# Patient Record
Sex: Female | Born: 1975 | Race: White | Hispanic: No | Marital: Married | Smoking: Never smoker
Health system: Southern US, Community
[De-identification: ages and names within clinical notes are randomized; demographics above are authoritative.]

## PROBLEM LIST (undated history)

## (undated) DIAGNOSIS — R002 Palpitations: Secondary | ICD-10-CM

## (undated) DIAGNOSIS — I341 Nonrheumatic mitral (valve) prolapse: Secondary | ICD-10-CM

## (undated) DIAGNOSIS — Z8669 Personal history of other diseases of the nervous system and sense organs: Secondary | ICD-10-CM

## (undated) DIAGNOSIS — Z9289 Personal history of other medical treatment: Secondary | ICD-10-CM

## (undated) DIAGNOSIS — I340 Nonrheumatic mitral (valve) insufficiency: Secondary | ICD-10-CM

## (undated) DIAGNOSIS — K559 Vascular disorder of intestine, unspecified: Secondary | ICD-10-CM

## (undated) HISTORY — DX: Nonrheumatic mitral (valve) insufficiency: I34.0

## (undated) HISTORY — DX: Vascular disorder of intestine, unspecified: K55.9

## (undated) HISTORY — DX: Personal history of other medical treatment: Z92.89

## (undated) HISTORY — DX: Personal history of other diseases of the nervous system and sense organs: Z86.69

## (undated) HISTORY — DX: Palpitations: R00.2

## (undated) HISTORY — DX: Nonrheumatic mitral (valve) prolapse: I34.1

---
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
## 2005-04-07 HISTORY — PX: GALLBLADDER SURGERY: SHX652

## 2007-04-08 HISTORY — PX: OTHER SURGICAL HISTORY: SHX169

## 2010-02-22 DIAGNOSIS — Z9289 Personal history of other medical treatment: Secondary | ICD-10-CM

## 2010-02-22 HISTORY — DX: Personal history of other medical treatment: Z92.89

## 2012-10-27 ENCOUNTER — Encounter: Payer: Self-pay | Admitting: *Deleted

## 2012-10-27 ENCOUNTER — Encounter: Payer: Self-pay | Admitting: Internal Medicine

## 2012-10-28 ENCOUNTER — Encounter: Payer: Self-pay | Admitting: Internal Medicine

## 2012-10-28 ENCOUNTER — Ambulatory Visit (INDEPENDENT_AMBULATORY_CARE_PROVIDER_SITE_OTHER): Payer: BC Managed Care – PPO | Admitting: Internal Medicine

## 2012-10-28 VITALS — BP 116/72 | HR 66 | Ht 63.0 in | Wt 146.0 lb

## 2012-10-28 DIAGNOSIS — Z79899 Other long term (current) drug therapy: Secondary | ICD-10-CM

## 2012-10-28 DIAGNOSIS — Z8669 Personal history of other diseases of the nervous system and sense organs: Secondary | ICD-10-CM

## 2012-10-28 DIAGNOSIS — R002 Palpitations: Secondary | ICD-10-CM

## 2012-10-28 DIAGNOSIS — R0609 Other forms of dyspnea: Secondary | ICD-10-CM

## 2012-10-28 DIAGNOSIS — I340 Nonrheumatic mitral (valve) insufficiency: Secondary | ICD-10-CM

## 2012-10-28 DIAGNOSIS — I059 Rheumatic mitral valve disease, unspecified: Secondary | ICD-10-CM

## 2012-10-28 DIAGNOSIS — I959 Hypotension, unspecified: Secondary | ICD-10-CM

## 2012-10-28 DIAGNOSIS — R9431 Abnormal electrocardiogram [ECG] [EKG]: Secondary | ICD-10-CM

## 2012-10-28 DIAGNOSIS — I341 Nonrheumatic mitral (valve) prolapse: Secondary | ICD-10-CM

## 2012-10-28 NOTE — Progress Notes (Signed)
OFFICE NOTE  Chief Complaint:  Routine office visit, shortness of breath  Primary Care Physician: No primary provider on file.  HPI:  Erica Hampton  is a 37 year old female who lives in [place]. She has a history of palpitations in the past as well as mild bileaflet prolapse and mitral regurgitation. Her palpitations have improved significantly. While they do still continue to happen, she is not particularly bothered by them. She also has migraine headaches and has had problems with hypotension and recently problems with fatigue and difficulty in sleeping. She is followed by a neurologist there who is adjusting her medicines. We did talk a little bit about her sleep problems which include difficulty falling asleep and staying asleep throughout the night. She wakes up with occasional jerking suggesting possible restless leg mechanism or maybe a sleep apnea-type syndrome. She could have underlying narcolepsy as well. I recommended that she talk with a neurologist about possible sleep study. Otherwise, she feels well. Denies any chest pain but has been having some increasing shortness of breath with activity. Her main issue is ongoing headaches.  PMHx:  Past Medical History  Diagnosis Date  . Mitral regurgitation   . Heart palpitations     history of  . Hx of migraine headaches   . Mitral valve prolapse   . Ischemic colitis   . History of echocardiogram 02/22/2010    EF >55%; mild MR; mod mitral valve prolapse    Past Surgical History  Procedure Laterality Date  . Tonsillectomy  1983  . Gallbladder surgery  2007  . Laprascopy  2009    FAMHx:  Family History  Problem Relation Age of Onset  . Cancer Maternal Grandfather   . Cancer Paternal Grandfather   . Asthma Mother     also leaky valve, RLS  . Hyperlipidemia Mother   . Other Father     colon polpys, RLS  . Hypertension Maternal Grandmother   . Hyperlipidemia Maternal Grandmother   . Hypertension Paternal Grandmother    also leaky valve  . Hypertension Brother     also PAD, RLS    SOCHx:   reports that she has never smoked. She does not have any smokeless tobacco history on file. She reports that  drinks alcohol. She reports that she does not use illicit drugs.  ALLERGIES:  Allergies  Allergen Reactions  . Phenergan (Promethazine Hcl) Anaphylaxis    "paralyzed w/IV form   . Adhesive (Tape) Dermatitis  . Biaxin (Clarithromycin)     ROS: A comprehensive review of systems was negative except for: Respiratory: positive for dyspnea on exertion Cardiovascular: positive for palpitations Neurological: positive for headaches  HOME MEDS: Current Outpatient Prescriptions  Medication Sig Dispense Refill  . co-enzyme Q-10 30 MG capsule Take 200 mg by mouth daily.      . Coconut Oil 1000 MG CAPS Take 2,000 mg by mouth daily.      . Diclofenac Potassium (CAMBIA PO) Take by mouth as needed.      . diphenhydrAMINE (BENADRYL) 25 MG tablet Take 25 mg by mouth daily.      Marland Kitchen ibuprofen (ADVIL,MOTRIN) 800 MG tablet Take 800 mg by mouth every 8 (eight) hours as needed for pain.      Marland Kitchen loratadine (ALAVERT) 10 MG tablet Take 10 mg by mouth daily as needed for allergies.      Marland Kitchen ondansetron (ZOFRAN-ODT) 4 MG disintegrating tablet Take 4 mg by mouth every 8 (eight) hours as needed for nausea.      Marland Kitchen  RELPAX 40 MG tablet as needed.      . topiramate (TOPAMAX) 100 MG tablet daily. 1 1/2 tablets daily       No current facility-administered medications for this visit.    LABS/IMAGING: No results found for this or any previous visit (from the past 48 hour(s)). No results found.  VITALS: BP 116/72  Pulse 66  Ht 5\' 3"  (1.6 m)  Wt 146 lb (66.225 kg)  BMI 25.87 kg/m2  EXAM: General appearance: alert and no distress Neck: no adenopathy, no carotid bruit, no JVD, supple, symmetrical, trachea midline and thyroid not enlarged, symmetric, no tenderness/mass/nodules Lungs: clear to auscultation bilaterally Heart: regular  rate and rhythm, S1, S2 normal and systolic murmur: early systolic 3/6, crescendo at apex Abdomen: soft, non-tender; bowel sounds normal; no masses,  no organomegaly Extremities: extremities normal, atraumatic, no cyanosis or edema Pulses: 2+ and symmetric Skin: Skin color, texture, turgor normal. No rashes or lesions Neurologic: Grossly normal  EKG: Normal sinus rhythm at 66 with T wave inversions in 2,3, and aVF and V3-V6  ASSESSMENT: 1. Dyspnea on exertion 2. Migraine headaches 3. Mitral valve prolapse with mitral regurgitation 4. Orthostatic hypotension  PLAN: 1.   Erica Hampton is having some worsening shortness of breath with exertion, but she thinks this is mostly deconditioning. She does have some new EKG changes including T-wave inversions inferiorly and laterally. This may or may not represent ischemia, however I like her to undergo exercise cardia metabolic stress testing. In addition has been 3 years since her last echocardiogram, like to check for stability of her mitral valve apparatus and a bubble study to look for PFO as a possible correlate to her migraine headaches. Unfortunately, there is not great data and closing PFO's as far as the ability to decrease migraine headache frequency. Will also go and check screening labs including a lipid profile CMP and A1c per her request today and for risk factors modification. Plan to see her back in a few weeks.  Chrystie Nose, MD, [facility] Attending Cardiologist [facility]  Erica Hampton C 10/28/2012, 11:31 AM

## 2012-10-28 NOTE — Patient Instructions (Signed)
Your physician has requested that you have an echocardiogram. Echocardiography is a painless test that uses sound waves to create images of your heart. It provides your doctor with information about the size and shape of your heart and how well your heart's chambers and valves are working. This procedure takes approximately one hour. There are no restrictions for this procedure.  Dr Rennis Golden has ordered a MET test that will also check your pulmonary function.  Your physician recommends that you return for lab work in: next 1-2 weeks. You will need to be fasting for this lab work.  CMP, A1C, Lipid  We will follow up with you in a month, after your tests.

## 2012-10-29 LAB — COMPREHENSIVE METABOLIC PANEL
AST: 20 U/L (ref 0–37)
Albumin: 4.7 g/dL (ref 3.5–5.2)
Alkaline Phosphatase: 51 U/L (ref 39–117)
BUN: 14 mg/dL (ref 6–23)
Creat: 0.86 mg/dL (ref 0.50–1.10)
Glucose, Bld: 92 mg/dL (ref 70–99)
Total Bilirubin: 0.5 mg/dL (ref 0.3–1.2)

## 2012-10-29 LAB — LIPID PANEL
Cholesterol: 200 mg/dL (ref 0–200)
HDL: 51 mg/dL (ref >39)
Total CHOL/HDL Ratio: 3.9 Ratio
Triglycerides: 87 mg/dL (ref <150)
VLDL: 17 mg/dL (ref 0–40)

## 2012-10-29 LAB — HEMOGLOBIN A1C: Mean Plasma Glucose: 103 mg/dL (ref <117)

## 2012-11-10 ENCOUNTER — Ambulatory Visit (HOSPITAL_COMMUNITY)
Admission: RE | Admit: 2012-11-10 | Discharge: 2012-11-10 | Disposition: A | Discharge status: Home / Self Care | Payer: BC Managed Care – PPO | Source: Ambulatory Visit | Attending: Cardiovascular Disease | Admitting: Cardiovascular Disease

## 2012-11-10 ENCOUNTER — Telehealth: Payer: Self-pay | Admitting: Cardiovascular Disease

## 2012-11-10 DIAGNOSIS — R0989 Other specified symptoms and signs involving the circulatory and respiratory systems: Secondary | ICD-10-CM

## 2012-11-10 DIAGNOSIS — I341 Nonrheumatic mitral (valve) prolapse: Secondary | ICD-10-CM

## 2012-11-10 DIAGNOSIS — I059 Rheumatic mitral valve disease, unspecified: Secondary | ICD-10-CM

## 2012-11-10 NOTE — Telephone Encounter (Signed)
Pt is here having a test. She is requesting a copy of her recent lab results. Please advise patient

## 2012-11-10 NOTE — Progress Notes (Signed)
2D Echo Performed 11/10/2012    Jeslynn Hollander, RCS  

## 2012-11-10 NOTE — Telephone Encounter (Signed)
Message forwarded to Medical Records to process ROI.  Pt in office for test and will stop by front desk after appt.

## 2012-12-13 ENCOUNTER — Encounter (INDEPENDENT_AMBULATORY_CARE_PROVIDER_SITE_OTHER): Payer: BC Managed Care – PPO

## 2012-12-13 DIAGNOSIS — R0602 Shortness of breath: Secondary | ICD-10-CM

## 2012-12-17 ENCOUNTER — Ambulatory Visit: Payer: BC Managed Care – PPO | Admitting: Internal Medicine

## 2012-12-23 ENCOUNTER — Encounter: Payer: Self-pay | Admitting: Internal Medicine

## 2012-12-23 ENCOUNTER — Ambulatory Visit (INDEPENDENT_AMBULATORY_CARE_PROVIDER_SITE_OTHER): Payer: BC Managed Care – PPO | Admitting: Internal Medicine

## 2012-12-23 VITALS — BP 112/78 | HR 68 | Ht 62.5 in | Wt 147.8 lb

## 2012-12-23 DIAGNOSIS — Q2112 Patent foramen ovale: Secondary | ICD-10-CM | POA: Insufficient documentation

## 2012-12-23 DIAGNOSIS — Q211 Atrial septal defect: Secondary | ICD-10-CM | POA: Insufficient documentation

## 2012-12-23 DIAGNOSIS — Z8669 Personal history of other diseases of the nervous system and sense organs: Secondary | ICD-10-CM

## 2012-12-23 DIAGNOSIS — D689 Coagulation defect, unspecified: Secondary | ICD-10-CM

## 2012-12-23 DIAGNOSIS — I059 Rheumatic mitral valve disease, unspecified: Secondary | ICD-10-CM

## 2012-12-23 DIAGNOSIS — R5381 Other malaise: Secondary | ICD-10-CM

## 2012-12-23 DIAGNOSIS — I340 Nonrheumatic mitral (valve) insufficiency: Secondary | ICD-10-CM

## 2012-12-23 DIAGNOSIS — R9439 Abnormal result of other cardiovascular function study: Secondary | ICD-10-CM | POA: Insufficient documentation

## 2012-12-23 DIAGNOSIS — Z01818 Encounter for other preprocedural examination: Secondary | ICD-10-CM

## 2012-12-23 DIAGNOSIS — I341 Nonrheumatic mitral (valve) prolapse: Secondary | ICD-10-CM

## 2012-12-23 NOTE — Progress Notes (Signed)
OFFICE NOTE  Chief Complaint:  Routine office visit, shortness of breath  Primary Care Physician: No primary provider on file.  HPI:  Erica Hampton  is a 37 year old female who lives in [place]. She has a history of palpitations in the past as well as mild bileaflet prolapse and mitral regurgitation. Her palpitations have improved significantly. While they do still continue to happen, she is not particularly bothered by them. She also has migraine headaches and has had problems with hypotension and recently problems with fatigue and difficulty in sleeping. She is followed by a neurologist there who is adjusting her medicines. We did talk a little bit about her sleep problems which include difficulty falling asleep and staying asleep throughout the night. She wakes up with occasional jerking suggesting possible restless leg mechanism or maybe a sleep apnea-type syndrome. She could have underlying narcolepsy as well. I recommended that she talk with a neurologist about possible sleep study. Otherwise, she feels well. Denies any chest pain but has been having some increasing shortness of breath with activity. Her main issue is ongoing headaches.  She underwent cardio metabolic testing on 12/13/2012. This demonstrated an RAR of 1.07 with a peak VO2 of only 66%. Peak heart rate was 84% predicted. PVCs were noted during exercise. Her she underwent pulmonary function testing as well which shows generally preserved pulmonary function.  She hasn't had an echocardiogram with saline contrast on 11/10/2012. This demonstrated moderate holosystolic bileaflet mitral valve prolapse. In addition there was a PFO noted by saline contrast injection. Shortly after that injection, she was noted to have intense chest pain that went to her jaw and eventually lessened followed by a migraine headache. She has been recently having more frequent migraine headaches. Just reports an episode recently where she was driving and  became totally unaware of where she was at. This persisted for a short period of time and then resolved, and is concerning for possible TIA.  PMHx:  Past Medical History  Diagnosis Date  . Mitral regurgitation   . Heart palpitations     history of  . Hx of migraine headaches   . Mitral valve prolapse   . Ischemic colitis   . History of echocardiogram 02/22/2010    EF >55%; mild MR; mod mitral valve prolapse    Past Surgical History  Procedure Laterality Date  . Tonsillectomy  1983  . Gallbladder surgery  2007  . Laprascopy  2009    FAMHx:  Family History  Problem Relation Age of Onset  . Cancer Maternal Grandfather   . Cancer Paternal Grandfather   . Asthma Mother     also leaky valve, RLS  . Hyperlipidemia Mother   . Other Father     colon polpys, RLS  . Hypertension Maternal Grandmother   . Hyperlipidemia Maternal Grandmother   . Hypertension Paternal Grandmother     also leaky valve  . Hypertension Brother     also PAD, RLS    SOCHx:   reports that she has never smoked. She does not have any smokeless tobacco history on file. She reports that  drinks alcohol. She reports that she does not use illicit drugs.  ALLERGIES:  Allergies  Allergen Reactions  . Phenergan [Promethazine Hcl] Anaphylaxis    "paralyzed w/IV form   . Adhesive [Tape] Dermatitis  . Biaxin [Clarithromycin]     ROS: A comprehensive review of systems was negative except for: Respiratory: positive for dyspnea on exertion Cardiovascular: positive for palpitations Neurological: positive for headaches  HOME MEDS: Current Outpatient Prescriptions  Medication Sig Dispense Refill  . co-enzyme Q-10 30 MG capsule Take 200 mg by mouth daily.      . Coconut Oil 1000 MG CAPS Take 2,000 mg by mouth daily.      . Diclofenac Potassium (CAMBIA PO) Take by mouth as needed.      . diphenhydrAMINE (BENADRYL) 25 MG tablet Take 25 mg by mouth daily.      Marland Kitchen ibuprofen (ADVIL,MOTRIN) 800 MG tablet Take 800  mg by mouth every 8 (eight) hours as needed for pain.      Marland Kitchen loratadine (ALAVERT) 10 MG tablet Take 10 mg by mouth daily as needed for allergies.      Marland Kitchen ondansetron (ZOFRAN-ODT) 4 MG disintegrating tablet Take 4 mg by mouth every 8 (eight) hours as needed for nausea.      . RELPAX 40 MG tablet as needed.      . topiramate (TOPAMAX) 100 MG tablet daily. 1 1/2 tablets daily       No current facility-administered medications for this visit.    LABS/IMAGING: No results found for this or any previous visit (from the past 48 hour(s)). No results found.  VITALS: BP 112/78  Pulse 68  Ht 5' 2.5" (1.588 m)  Wt 147 lb 12.8 oz (67.042 kg)  BMI 26.59 kg/m2  EXAM: deferred  EKG: deferred  ASSESSMENT: 1. Dyspnea on exertion - abnormal cardiometabolic testing with low VO2 of 66% 2. Migraine headaches 3. Holosystolic bifleaflet mitral valve prolapse with mitral regurgitation 4. Orthostatic hypotension 5. 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 Chest pain - fatigue  PLAN: 1.   Erica Hampton is having some worsening shortness of breath and more frequent headaches. After her echocardiogram she experienced intense chest pain and jaw pain following her contrast saline injection. She did have a brisk right to left shunt through a PFO. It is certainly possible that she could have had an air embolism event or possibly thrombus that led to chest pain. She could also have had coronary spasm, which I suspect she is at higher risk for given her migraines. Recently she had an episode where she was confused as to where she was while driving a route that she knows very well. This was very frightening for her and is suspicious for TIA.  I think now that we know that she has right to left shunting, I will strongly recommend starting low-dose aspirin 81 mg daily to help prevent against possible paradoxical embolus. I encouraged her to contact her neurologist to see if any of her medications are possibly playing a role  in her memory problem. Given the marked abnormality on her echo and her progressive shortness of breath, she may have some significant shunting and I feel that further evaluation with a TEE is warranted. The other question is as to whether or not she needs a coronary artery evaluation. There are a few traditional cardiac risk factors, however she may be having coronary artery spasm, but this would not likely explain why she has a low exertional VO2.  Shunting may explain this to some extent.  After discussing options with her and the risks and benefits of a TEE, she did provide informed consent in the office today. We'll try to arrange that for next week. Again, she may ultimately need cardiac catheterization to evaluate her coronaries.  Chrystie Nose, MD, [facility] Attending Cardiologist [facility]  HILTY,Kenneth C 12/23/2012, 4:38 PM

## 2012-12-23 NOTE — Patient Instructions (Addendum)
Please schedule a transesophageal echocardiogram with Dr. Rennis Golden.  You will need to have blood work done within a 7 day window of this procedure.  Please use a [facility] These are the closest to your address: [address], [address] [place], [place] [address] [phone]  9:00am-12:00pm  7:00am-4:00pm  [address] [place], [place] [address] [phone]  8:00am-5:00pm

## 2012-12-27 ENCOUNTER — Encounter (HOSPITAL_COMMUNITY): Payer: Self-pay | Admitting: Internal Medicine

## 2012-12-31 ENCOUNTER — Ambulatory Visit (HOSPITAL_COMMUNITY)
Admission: RE | Admit: 2012-12-31 | Discharge: 2012-12-31 | Disposition: A | Discharge status: Home / Self Care | Payer: BC Managed Care – PPO | Source: Ambulatory Visit | Attending: Internal Medicine | Admitting: Internal Medicine

## 2012-12-31 ENCOUNTER — Encounter (HOSPITAL_COMMUNITY)
Admission: RE | Disposition: A | Discharge status: Home / Self Care | Payer: Self-pay | Source: Ambulatory Visit | Attending: Internal Medicine

## 2012-12-31 ENCOUNTER — Other Ambulatory Visit: Payer: Self-pay | Admitting: Internal Medicine

## 2012-12-31 ENCOUNTER — Encounter (HOSPITAL_COMMUNITY): Payer: Self-pay | Admitting: *Deleted

## 2012-12-31 DIAGNOSIS — R9439 Abnormal result of other cardiovascular function study: Secondary | ICD-10-CM

## 2012-12-31 DIAGNOSIS — I059 Rheumatic mitral valve disease, unspecified: Secondary | ICD-10-CM | POA: Insufficient documentation

## 2012-12-31 DIAGNOSIS — I340 Nonrheumatic mitral (valve) insufficiency: Secondary | ICD-10-CM

## 2012-12-31 DIAGNOSIS — Q211 Atrial septal defect: Secondary | ICD-10-CM

## 2012-12-31 DIAGNOSIS — R0602 Shortness of breath: Secondary | ICD-10-CM

## 2012-12-31 DIAGNOSIS — R0609 Other forms of dyspnea: Secondary | ICD-10-CM | POA: Insufficient documentation

## 2012-12-31 DIAGNOSIS — R0989 Other specified symptoms and signs involving the circulatory and respiratory systems: Secondary | ICD-10-CM | POA: Insufficient documentation

## 2012-12-31 DIAGNOSIS — Z01818 Encounter for other preprocedural examination: Secondary | ICD-10-CM

## 2012-12-31 DIAGNOSIS — R230 Cyanosis: Secondary | ICD-10-CM

## 2012-12-31 DIAGNOSIS — Q2112 Patent foramen ovale: Secondary | ICD-10-CM

## 2012-12-31 DIAGNOSIS — I341 Nonrheumatic mitral (valve) prolapse: Secondary | ICD-10-CM

## 2012-12-31 DIAGNOSIS — R002 Palpitations: Secondary | ICD-10-CM

## 2012-12-31 DIAGNOSIS — Z8669 Personal history of other diseases of the nervous system and sense organs: Secondary | ICD-10-CM

## 2012-12-31 HISTORY — PX: TEE WITHOUT CARDIOVERSION: SHX5443

## 2012-12-31 LAB — BASIC METABOLIC PANEL
Calcium: 9.4 mg/dL (ref 8.4–10.5)
Creatinine, Ser: 0.81 mg/dL (ref 0.50–1.10)
GFR calc Af Amer: >90 mL/min (ref >90)
GFR calc non Af Amer: >90 mL/min (ref >90)
Sodium: 138 mEq/L (ref 135–145)

## 2012-12-31 LAB — PROTIME-INR
INR: 0.94 (ref 0.00–1.49)
Prothrombin Time: 12.4 seconds (ref 11.6–15.2)

## 2012-12-31 LAB — CBC
MCV: 86.1 fL (ref 78.0–100.0)
Platelets: 195 10*3/uL (ref 150–400)
RDW: 12.4 % (ref 11.5–15.5)
WBC: 4.2 10*3/uL (ref 4.0–10.5)

## 2012-12-31 SURGERY — ECHOCARDIOGRAM, TRANSESOPHAGEAL
Anesthesia: Moderate Sedation

## 2012-12-31 MED ORDER — SODIUM CHLORIDE 0.9 % IV SOLN
INTRAVENOUS | Status: DC
  Administered 2012-12-31: 08:00:00 via INTRAVENOUS

## 2012-12-31 MED ORDER — MIDAZOLAM HCL 10 MG/2ML IJ SOLN
INTRAMUSCULAR | Status: DC | PRN
  Administered 2012-12-31: 2 mg via INTRAVENOUS
  Administered 2012-12-31: 1 mg via INTRAVENOUS
  Administered 2012-12-31 (×3): 2 mg via INTRAVENOUS

## 2012-12-31 MED ORDER — FENTANYL CITRATE 0.05 MG/ML IJ SOLN
INTRAMUSCULAR | Status: AC
  Filled 2012-12-31: qty 2

## 2012-12-31 MED ORDER — DIPHENHYDRAMINE HCL 50 MG/ML IJ SOLN
INTRAMUSCULAR | Status: AC
  Filled 2012-12-31: qty 1

## 2012-12-31 MED ORDER — MIDAZOLAM HCL 5 MG/ML IJ SOLN
INTRAMUSCULAR | Status: AC
  Filled 2012-12-31: qty 2

## 2012-12-31 MED ORDER — FENTANYL CITRATE 0.05 MG/ML IJ SOLN
INTRAMUSCULAR | Status: DC | PRN
  Administered 2012-12-31 (×3): 25 ug via INTRAVENOUS

## 2012-12-31 MED ORDER — LIDOCAINE VISCOUS 2 % MT SOLN
OROMUCOSAL | Status: AC
  Filled 2012-12-31: qty 15

## 2012-12-31 MED ORDER — LIDOCAINE VISCOUS 2 % MT SOLN
OROMUCOSAL | Status: DC | PRN
  Administered 2012-12-31: 1 via OROMUCOSAL

## 2012-12-31 NOTE — H&P (Signed)
    INTERVAL PROCEDURE H&P  History and Physical Interval Note:  12/31/2012 8:40 AM  Erica Hampton has presented today for their planned procedure. The various methods of treatment have been discussed with the patient and family. After consideration of risks, benefits and other options for treatment, the patient has consented to the procedure.  The patients' outpatient history has been reviewed, patient examined, and no change in status from most recent office note within the past 30 days. I have reviewed the patients' chart and labs and will proceed as planned. Questions were answered to the patient's satisfaction.   Erica Nose, MD, [facility] Attending Cardiologist [facility]  Erica Hampton 12/31/2012, 8:40 AM

## 2012-12-31 NOTE — CV Procedure (Signed)
TRANSESOPHAGEAL ECHOCARDIOGRAM (TEE) NOTE  INDICATIONS: PFO, dyspnea, ?right to left shunt  PROCEDURE:   Informed consent was obtained prior to the procedure. The risks, benefits and alternatives for the procedure were discussed and the patient comprehended these risks.  Risks include, but are not limited to, cough, sore throat, vomiting, nausea, somnolence, esophageal and stomach trauma or perforation, bleeding, low blood pressure, aspiration, pneumonia, infection, trauma to the teeth and death.    After a procedural time-out, the patient was given 5 mg versed and 75 mcg fentanyl for moderate sedation.  The oropharynx was anesthetized 10 cc of topical 1% viscous lidocaine and 2 cetacaine sprays.  The transesophageal probe was inserted in the esophagus and stomach without difficulty and multiple views were obtained.  The patient was kept under observation until the patient left the procedure room.  The patient left the procedure room in stable condition.   Agitated microbubble saline contrast was administered.  COMPLICATIONS:    There were no immediate complications.  Findings:  1. LEFT VENTRICLE: The left ventricular wall thickness is normal. There is a thin membranous ventricular septum - there is question of a small, restrictive membranous VSD. The left ventricular cavity is normal in size. Wall motion is normal.  LVEF is 65%.  2. RIGHT VENTRICLE:  The right ventricle is normal in structure and function without any thrombus or masses.    3. LEFT ATRIUM:  The left atrium is normal in size without any thrombus or masses.  There is not spontaneous echo contrast ("smoke") in the left atrium consistent with a low flow state.  4. LEFT ATRIAL APPENDAGE:  The left atrial appendage is free of any thrombus or masses. The appendage has single lobes. Pulse doppler indicates high flow in the appendage.  5. ATRIAL SEPTUM:  The atrial septum appears intact and is free of thrombus and/or masses.   There is no evidence for interatrial shunting by color doppler and saline microbubble. 6.  7. RIGHT ATRIUM:  The right atrium is normal in size and function without any thrombus or masses.  8. MITRAL VALVE:  The mitral valve is notable for prolapse of the A2 segment of the anterior mitral leaflet. There is Mild regurgitation.  There were no vegetations or stenosis.  9. AORTIC VALVE:  The aortic valve is normal in structure and function with trace central regurgitation.  There were no vegetations or stenosis  10. TRICUSPID VALVE:  The tricuspid valve is normal in structure and function with Mild regurgitation.  There were no vegetations or stenosis  11.  PULMONIC VALVE:  The pulmonic valve is normal in structure and function with no regurgitation.  There were no vegetations or stenosis.   12. AORTIC ARCH, ASCENDING AND DESCENDING AORTA:  There was no atherosclerosis of the ascending aorta, aortic arch, or proximal descending aorta.  IMPRESSION:   1. Mobile interatrial septum, with evidence for spontaneous or provokable right to left shunt. 2. 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 3. Mitral valve prolapse, specifically the A2 segment of the anterior leaflet. There is mild regurgitation with 2 distinct jets.  RECOMMENDATIONS:     Bubble study was negative for R->L shunt, althought it was previously positive. It was noted that the patient when placed upright after the tube was removed was markedly less "dusky" in skin appearance of the face. Her 2D echo bubble study was performed completely supine. In my mind, this raises the possibilty of positional shunt. Her O2 saturation was preserved, although she was on 4L/min  nasal cannula. There is possibly a small, restrictive membranous VSD by color doppler. She could be having orthocyanosis, which may suggest shunting at the pulmonary level (pulmonary AV fistula). I have recommended an outpatient CT pulmonary angiogram to evaluate  further. May need a pulmonary consult. (It should also be noted her VO2 was quite low at 60% with cardiometabolic exercise testing).  Time Spent Directly with the Patient:  75 minutes   Chrystie Nose, MD, [facility] Attending Cardiologist [facility]  12/31/2012, 4:48 PM

## 2012-12-31 NOTE — Progress Notes (Signed)
  Echocardiogram Echocardiogram Transesophageal has been performed.  Coty Larsh FRANCES 12/31/2012, 10:41 AM

## 2013-01-03 ENCOUNTER — Telehealth: Payer: Self-pay | Admitting: Internal Medicine

## 2013-01-03 ENCOUNTER — Encounter (HOSPITAL_COMMUNITY): Payer: Self-pay | Admitting: Internal Medicine

## 2013-01-03 NOTE — Telephone Encounter (Signed)
Returned call.  Pt stated she has a few questions from Friday.  Stated she did get results from Dr. Rennis Golden, but forgot to ask these questions.    1) Does pt still need to take baby ASA? 2) Does pt need abxs before dental work? 3) Can pt have CT scan in [place]?  Number to imaging facility is [phone]  Pt informed Dr. Rennis Golden and Eileen Stanford, his nurse, will be notified for further instructions.

## 2013-01-03 NOTE — Telephone Encounter (Signed)
Had TEE on Friday-Have some questions she need to ask-She said Dr Rennis Golden or his nurse could call her please.Marland Kitchen

## 2013-01-05 ENCOUNTER — Telehealth: Payer: Self-pay | Admitting: Internal Medicine

## 2013-01-05 NOTE — Telephone Encounter (Signed)
Erica Hampton returning call from 9/29 .

## 2013-01-05 NOTE — Telephone Encounter (Signed)
1.  Please notify her that she can stop baby ASA.  2. There is no longer an indication to take antibiotics prior to dental work for mitral valve prolapse. 3.  She cannot have the CT scan in [place] - it is not in our health system and the images need to be reviewed by myself and/or the pulmonologist if I refer her.  Thanks.  -Dr. Rennis Golden

## 2013-01-05 NOTE — Telephone Encounter (Signed)
Returned patient's phone call regarding questions for Dr. Rennis Golden. Provided patient with following responses.  - she can stop baby ASA.  -  no longer an indication to take antibiotics prior to dental work for mitral valve prolapse.  - cannot have the CT scan in [place] - it is not in our health system and the images need to be reviewed by myself and/or the pulmonologist if I refer her.  Patient verbalized understanding and informed patient that message will be sent to schedulers to set up CT.

## 2013-01-05 NOTE — Telephone Encounter (Signed)
Left VM for patient to return call regarding questions for Dr. Rennis Golden

## 2013-01-06 ENCOUNTER — Telehealth: Payer: Self-pay | Admitting: Internal Medicine

## 2013-01-06 NOTE — Telephone Encounter (Signed)
Says Dr Rennis Golden wanted her to have Cat Scan this week and is still waiting for someone to call her about it

## 2013-01-06 NOTE — Telephone Encounter (Signed)
Tina can you look into this please?

## 2013-01-12 ENCOUNTER — Encounter (HOSPITAL_COMMUNITY): Payer: Self-pay

## 2013-01-12 ENCOUNTER — Ambulatory Visit (HOSPITAL_COMMUNITY)
Admission: RE | Admit: 2013-01-12 | Discharge: 2013-01-12 | Disposition: A | Discharge status: Home / Self Care | Payer: BC Managed Care – PPO | Source: Ambulatory Visit | Attending: Internal Medicine | Admitting: Internal Medicine

## 2013-01-12 DIAGNOSIS — Q211 Atrial septal defect: Secondary | ICD-10-CM

## 2013-01-12 DIAGNOSIS — R0989 Other specified symptoms and signs involving the circulatory and respiratory systems: Secondary | ICD-10-CM | POA: Insufficient documentation

## 2013-01-12 DIAGNOSIS — R0602 Shortness of breath: Secondary | ICD-10-CM

## 2013-01-12 DIAGNOSIS — R230 Cyanosis: Secondary | ICD-10-CM

## 2013-01-12 DIAGNOSIS — R0609 Other forms of dyspnea: Secondary | ICD-10-CM | POA: Insufficient documentation

## 2013-01-12 MED ORDER — IOHEXOL 350 MG/ML SOLN
100.0000 mL | Freq: Once | INTRAVENOUS | Status: AC | PRN
  Administered 2013-01-12: 100 mL via INTRAVENOUS

## 2013-01-14 NOTE — Progress Notes (Signed)
LMTCB 01/14/13 

## 2013-01-14 NOTE — Progress Notes (Signed)
Left VM to return call 

## 2013-01-17 NOTE — Telephone Encounter (Signed)
Erica Hampton I do not see an order for a CAT scan.

## 2013-01-19 ENCOUNTER — Encounter: Payer: Self-pay | Admitting: Internal Medicine

## 2013-01-19 ENCOUNTER — Telehealth: Payer: Self-pay | Admitting: Internal Medicine

## 2013-01-19 DIAGNOSIS — I341 Nonrheumatic mitral (valve) prolapse: Secondary | ICD-10-CM

## 2013-01-19 DIAGNOSIS — I340 Nonrheumatic mitral (valve) insufficiency: Secondary | ICD-10-CM

## 2013-01-19 DIAGNOSIS — Q211 Atrial septal defect: Secondary | ICD-10-CM

## 2013-01-19 NOTE — Telephone Encounter (Signed)
Chart reviewed and this is not a new problem.  CT was normal on 10.8.14.  Dr. Rennis Golden notified and stated may need referral to Pulmonary.  Message forwarded to Dr. Rennis Golden.

## 2013-01-19 NOTE — Telephone Encounter (Signed)
Discussed via email with Ms. Mcclay - will refer to Dr. Fannie Knee with [facility].  Dr. Rennis Golden

## 2013-02-10 ENCOUNTER — Other Ambulatory Visit: Payer: Self-pay

## 2014-06-25 IMAGING — CT CT ANGIO CHEST
2 of 9 series · 18 of 46 positions shown · IV contrast (APPLIED)
Comparison: None.

CLINICAL DATA: Dyspnea while lying supine, suspect a right to left
shunt, possibly a pulmonary AV fistula, possible intermittent PFO,
small membranous VSD, initial encounter.

CT ANGIOGRAPHY CHEST
TECHNIQUE: Multidetector CT imaging of the chest using the
standard protocol during bolus administration of intravenous
contrast. Multiplanar reconstructed images including MIPs were
obtained and reviewed to evaluate the vascular anatomy.
Contrast: 100mL OMNIPAQUE IOHEXOL 350 MG/ML SOLN

[Series 5: thins · axial · 0.50mm/px · z∈[-333,-157]mm · 15 of 199 slices shown]
[im 12/199  lung]
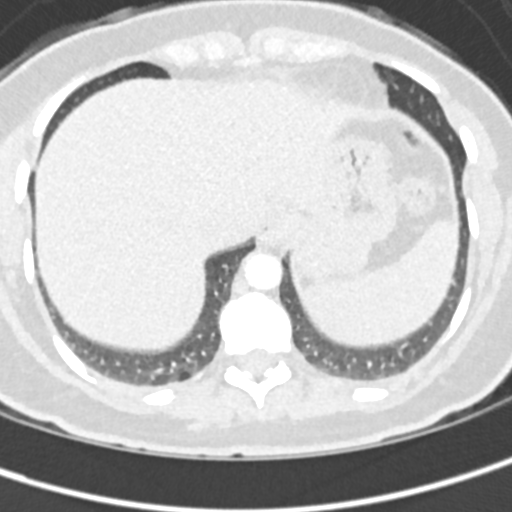
[im 23/199  soft-tissue]
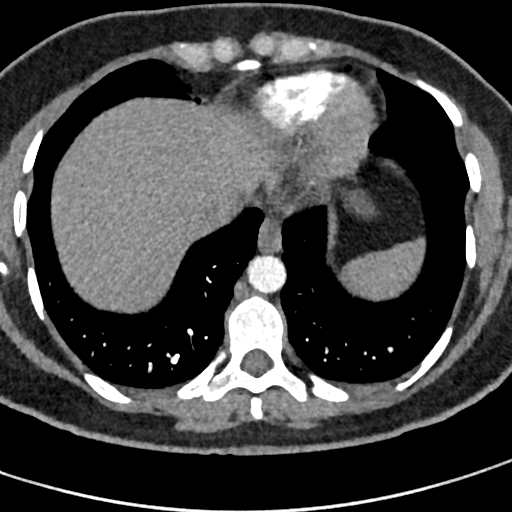
[im 34/199  lung]
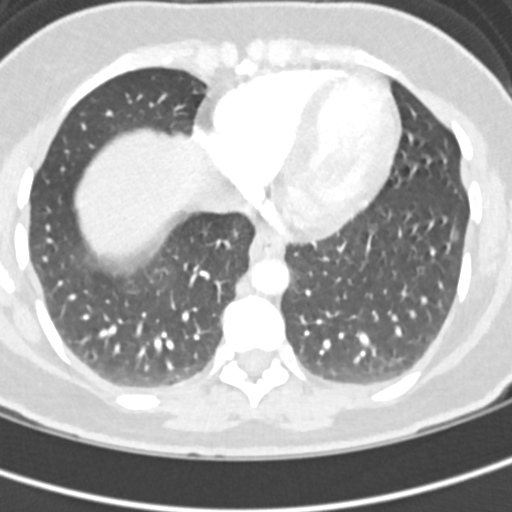
[im 45/199  soft-tissue]
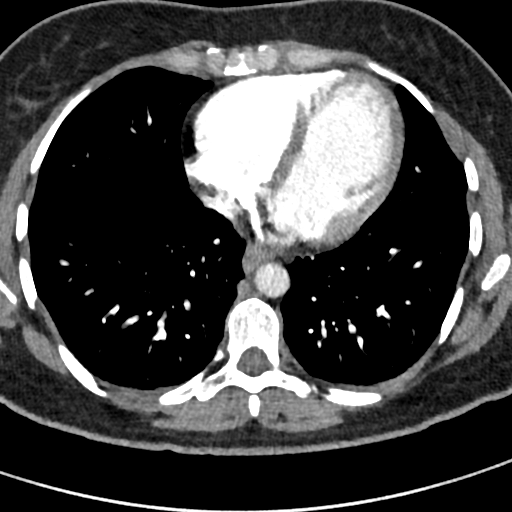
[im 67/199  lung]
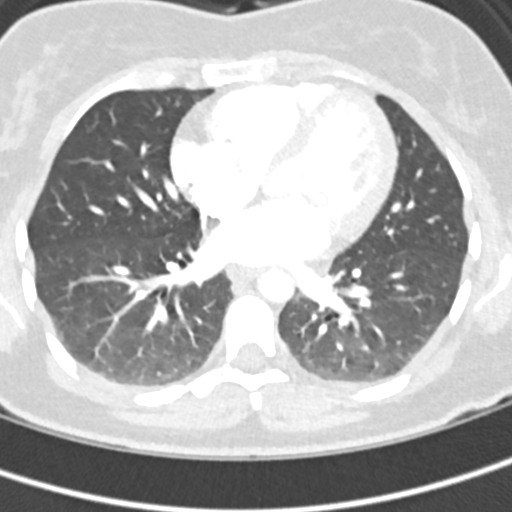
[im 78/199  soft-tissue]
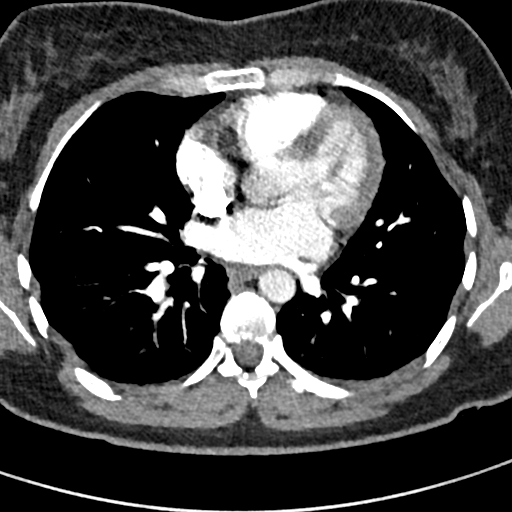
[im 89/199  lung]
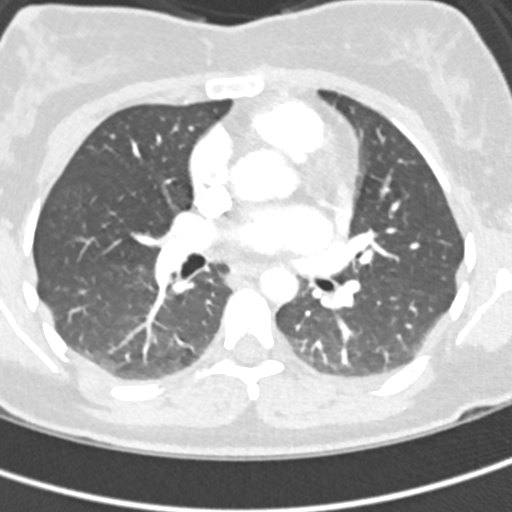
[im 100/199  soft-tissue]
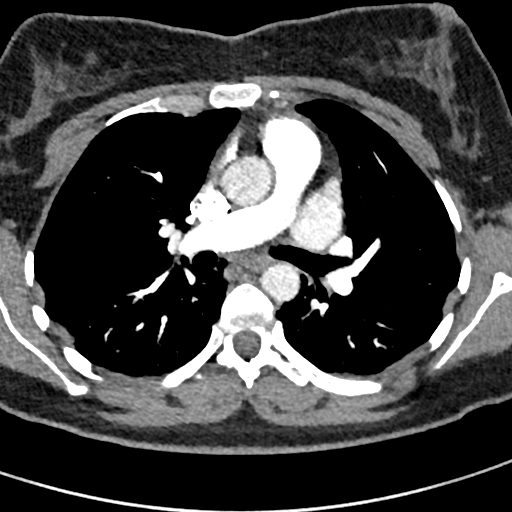
[im 111/199  lung]
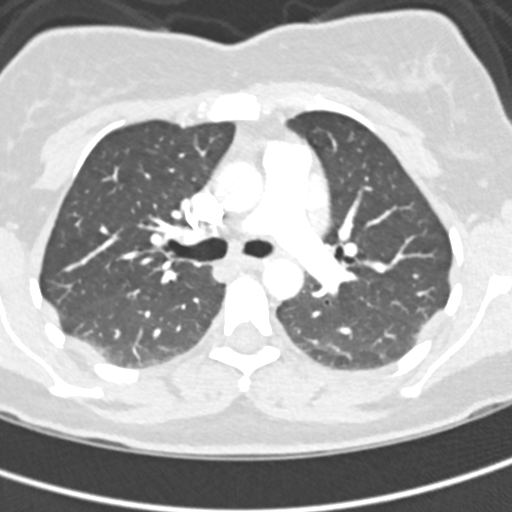
[im 122/199  soft-tissue]
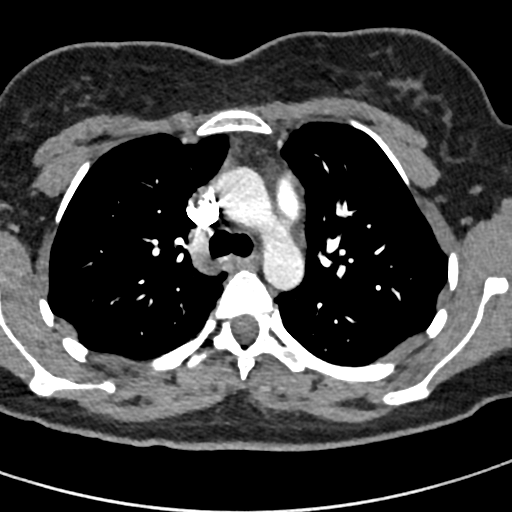
[im 133/199  lung]
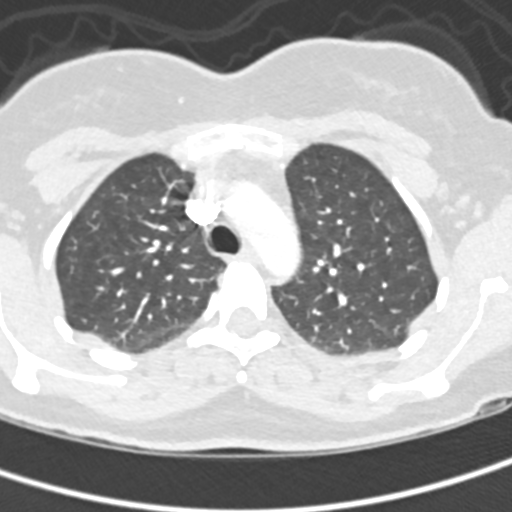
[im 155/199  soft-tissue]
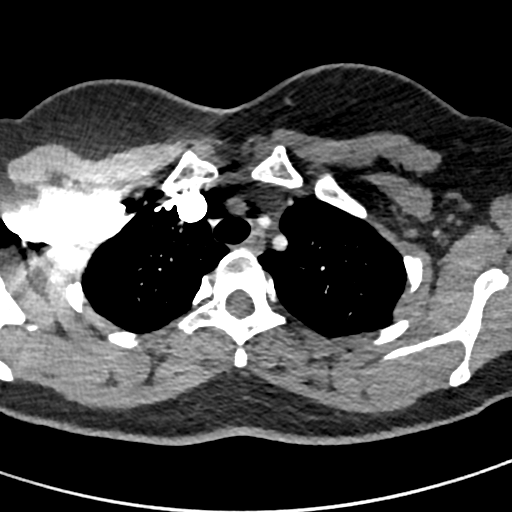
[im 166/199  lung]
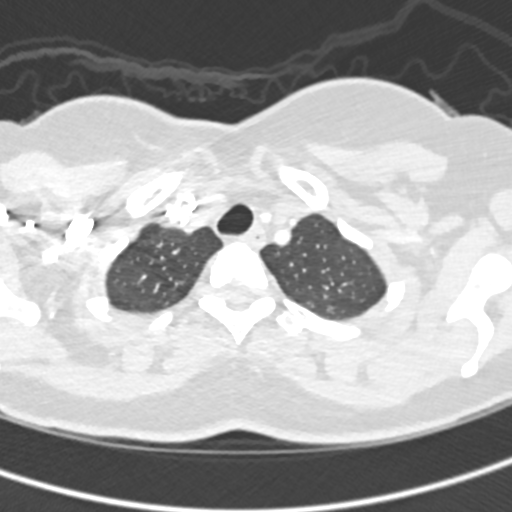
[im 177/199  soft-tissue]
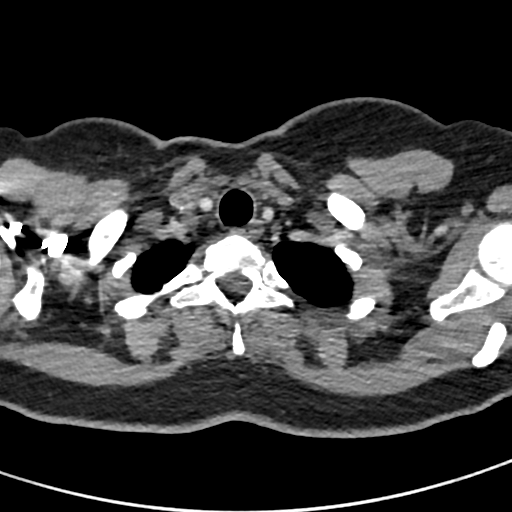
[im 188/199  lung]
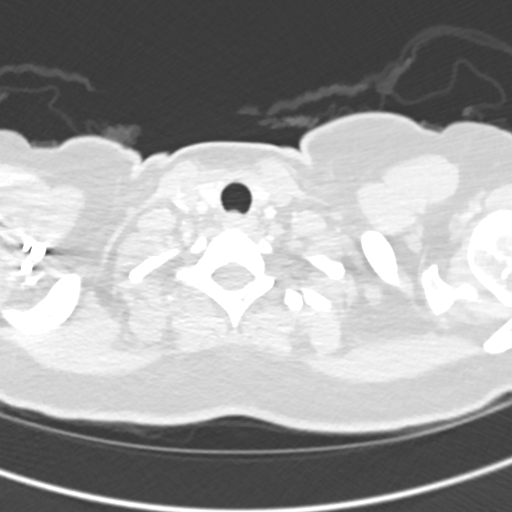

[Series 7: coronal mpr · coronal · 0.50mm/px · 3 of 119 slices shown]
[im 30/119  soft-tissue]
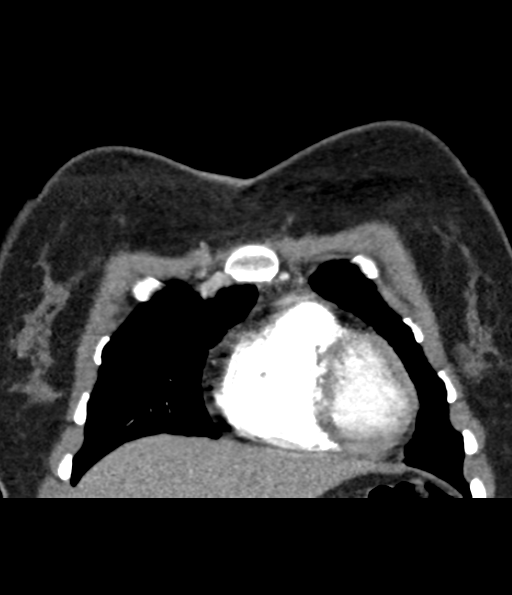
[im 60/119  soft-tissue]
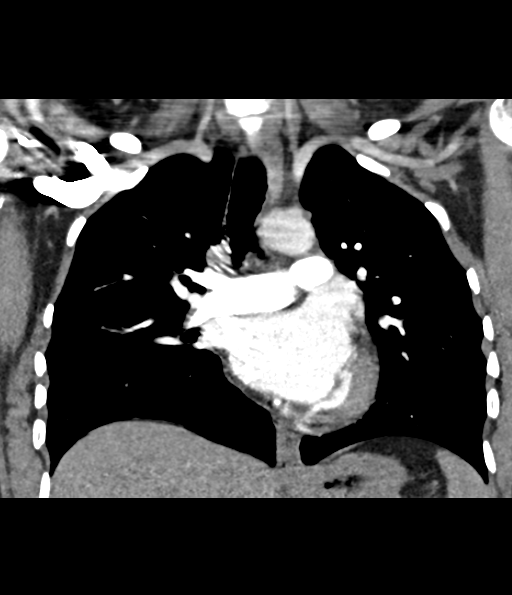
[im 89/119  soft-tissue]
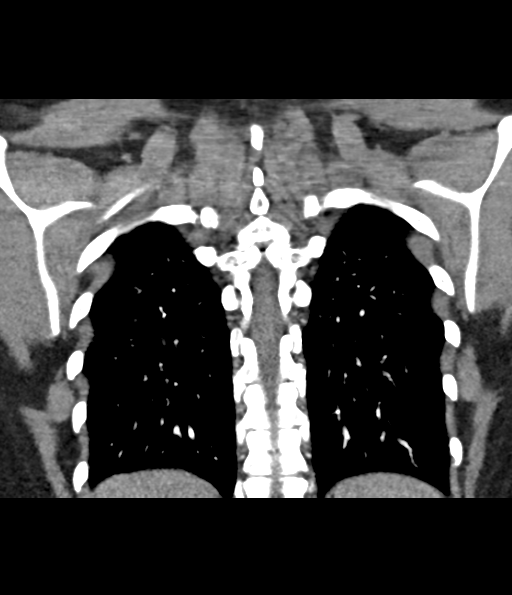

[18 of 46 positions shown; findings below may reference images not displayed]

Vascular Findings:

There is adequate opacification of the pulmonary arterial system
with the main pulmonary artery measuring 496 HU.  There are no
discrete filling defects within the pulmonary arterial tree to
suggest pulmonary embolism.  Normal caliber of the main pulmonary
artery.

Borderline cardiomegaly.  No pericardial effusion.

Normal caliber of the thoracic aorta. Conventional configuration of
the aortic arch.  Incidental note is made of an aortic nipple
(anomalous course of the left superior intercostal vein).

There is no evidence of pulmonary AVM.  Additionally, there is no
definitive evidence of anomalous pulmonary venous return.

-------------------------------------------------------------

Nonvascular findings:

Minimal gross asymmetric subpleural ground-glass atelectasis.  No
focal airspace opacities.  No pleural effusion or pneumothorax.  No
discrete pulmonary nodules.  The central pulmonary airways are
widely patent.  No mediastinal, hilar or axillary lymphadenopathy.

Limited early arterial phase evaluation of the upper abdomen is
normal.

No acute or aggressive osseous abnormalities.  Limited
visualization of the thyroid gland is normal.
IMPRESSION: 1.  No explanation for patient's positional dyspnea.  Specifically,
no evidence of pulmonary embolism, pulmonary AVM or anomalous
pulmonary venous return.

2.  Borderline cardiomegaly - given additional provided potential
etiologies of the patient's positional dyspnea (including possible
intermittent PFO and small membranous VSD), further ev aluation
with cardiac echo may be performed as clinically indicated.
